# Patient Record
Sex: Male | Born: 1995
Health system: Southern US, Community
[De-identification: ages and names within clinical notes are randomized; demographics above are authoritative.]

## PROBLEM LIST (undated history)

## (undated) DIAGNOSIS — Q899 Congenital malformation, unspecified: Secondary | ICD-10-CM

## (undated) DIAGNOSIS — Z9889 Other specified postprocedural states: Secondary | ICD-10-CM

## (undated) HISTORY — DX: Congenital malformation, unspecified: Q89.9

---
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
## 2019-04-06 ENCOUNTER — Emergency Department (HOSPITAL_COMMUNITY): Payer: Self-pay

## 2019-04-06 ENCOUNTER — Other Ambulatory Visit: Payer: Self-pay

## 2019-04-06 ENCOUNTER — Inpatient Hospital Stay (HOSPITAL_COMMUNITY): Payer: Self-pay | Admitting: Certified Registered"

## 2019-04-06 ENCOUNTER — Encounter (HOSPITAL_COMMUNITY)
Admission: EM | Disposition: A | Discharge status: Home / Self Care | Payer: Self-pay | Source: Home / Self Care | Attending: Internal Medicine

## 2019-04-06 ENCOUNTER — Inpatient Hospital Stay (HOSPITAL_COMMUNITY)
Admission: EM | Admit: 2019-04-06 | Discharge: 2019-04-07 | DRG: 132 | Disposition: A | Discharge status: Home / Self Care | Payer: Self-pay | Attending: Internal Medicine | Admitting: Internal Medicine

## 2019-04-06 ENCOUNTER — Encounter (HOSPITAL_COMMUNITY): Payer: Self-pay | Admitting: Emergency Medicine

## 2019-04-06 DIAGNOSIS — K760 Fatty (change of) liver, not elsewhere classified: Secondary | ICD-10-CM | POA: Diagnosis present

## 2019-04-06 DIAGNOSIS — E66811 Obesity, class 1: Secondary | ICD-10-CM | POA: Diagnosis present

## 2019-04-06 DIAGNOSIS — K011 Impacted teeth: Secondary | ICD-10-CM | POA: Diagnosis present

## 2019-04-06 DIAGNOSIS — R739 Hyperglycemia, unspecified: Secondary | ICD-10-CM | POA: Diagnosis present

## 2019-04-06 DIAGNOSIS — S0181XA Laceration without foreign body of other part of head, initial encounter: Secondary | ICD-10-CM | POA: Diagnosis present

## 2019-04-06 DIAGNOSIS — S02609A Fracture of mandible, unspecified, initial encounter for closed fracture: Secondary | ICD-10-CM | POA: Diagnosis present

## 2019-04-06 DIAGNOSIS — F1010 Alcohol abuse, uncomplicated: Secondary | ICD-10-CM | POA: Diagnosis present

## 2019-04-06 DIAGNOSIS — Z20828 Contact with and (suspected) exposure to other viral communicable diseases: Secondary | ICD-10-CM

## 2019-04-06 DIAGNOSIS — S02651A Fracture of angle of right mandible, initial encounter for closed fracture: Secondary | ICD-10-CM | POA: Diagnosis present

## 2019-04-06 DIAGNOSIS — R402362 Coma scale, best motor response, obeys commands, at arrival to emergency department: Secondary | ICD-10-CM | POA: Diagnosis present

## 2019-04-06 DIAGNOSIS — F1210 Cannabis abuse, uncomplicated: Secondary | ICD-10-CM | POA: Diagnosis present

## 2019-04-06 DIAGNOSIS — E1165 Type 2 diabetes mellitus with hyperglycemia: Secondary | ICD-10-CM | POA: Diagnosis present

## 2019-04-06 DIAGNOSIS — Y92410 Unspecified street and highway as the place of occurrence of the external cause: Secondary | ICD-10-CM

## 2019-04-06 DIAGNOSIS — Z6831 Body mass index (BMI) 31.0-31.9, adult: Secondary | ICD-10-CM

## 2019-04-06 DIAGNOSIS — Z23 Encounter for immunization: Secondary | ICD-10-CM

## 2019-04-06 DIAGNOSIS — R7401 Elevation of levels of liver transaminase levels: Secondary | ICD-10-CM

## 2019-04-06 DIAGNOSIS — R402142 Coma scale, eyes open, spontaneous, at arrival to emergency department: Secondary | ICD-10-CM | POA: Diagnosis present

## 2019-04-06 DIAGNOSIS — Y93C2 Activity, hand held interactive electronic device: Secondary | ICD-10-CM

## 2019-04-06 DIAGNOSIS — F17200 Nicotine dependence, unspecified, uncomplicated: Secondary | ICD-10-CM | POA: Diagnosis present

## 2019-04-06 DIAGNOSIS — R7989 Other specified abnormal findings of blood chemistry: Secondary | ICD-10-CM | POA: Diagnosis present

## 2019-04-06 DIAGNOSIS — R402252 Coma scale, best verbal response, oriented, at arrival to emergency department: Secondary | ICD-10-CM | POA: Diagnosis present

## 2019-04-06 DIAGNOSIS — S02602B Fracture of unspecified part of body of left mandible, initial encounter for open fracture: Principal | ICD-10-CM | POA: Diagnosis present

## 2019-04-06 DIAGNOSIS — E669 Obesity, unspecified: Secondary | ICD-10-CM | POA: Diagnosis present

## 2019-04-06 HISTORY — PX: ORIF MANDIBULAR FRACTURE: SHX2127

## 2019-04-06 LAB — CBC
HCT: 48.8 % (ref 39.0–52.0)
Hemoglobin: 17.3 g/dL — ABNORMAL HIGH (ref 13.0–17.0)
MCH: 34.1 pg — ABNORMAL HIGH (ref 26.0–34.0)
MCHC: 35.5 g/dL (ref 30.0–36.0)
MCV: 96.1 fL (ref 80.0–100.0)
Platelets: 232 10*3/uL (ref 150–400)
RBC: 5.08 MIL/uL (ref 4.22–5.81)
RDW: 12.6 % (ref 11.5–15.5)
WBC: 18.7 10*3/uL — ABNORMAL HIGH (ref 4.0–10.5)
nRBC: 0 % (ref 0.0–0.2)

## 2019-04-06 LAB — GLUCOSE, CAPILLARY
Glucose-Capillary: 130 mg/dL — ABNORMAL HIGH (ref 70–99)
Glucose-Capillary: 138 mg/dL — ABNORMAL HIGH (ref 70–99)
Glucose-Capillary: 178 mg/dL — ABNORMAL HIGH (ref 70–99)
Glucose-Capillary: 168 mg/dL — ABNORMAL HIGH (ref 70–99)

## 2019-04-06 LAB — PROTIME-INR
INR: 1 (ref 0.8–1.2)
Prothrombin Time: 13.2 seconds (ref 11.4–15.2)

## 2019-04-06 LAB — HEPATIC FUNCTION PANEL
ALT: 453 U/L — ABNORMAL HIGH (ref 0–44)
AST: 351 U/L — ABNORMAL HIGH (ref 15–41)
Albumin: 3.4 g/dL — ABNORMAL LOW (ref 3.5–5.0)
Alkaline Phosphatase: 100 U/L (ref 38–126)
Bilirubin, Direct: 0.2 mg/dL (ref 0.0–0.2)
Indirect Bilirubin: 0.5 mg/dL (ref 0.3–0.9)
Total Bilirubin: 0.7 mg/dL (ref 0.3–1.2)
Total Protein: 6.5 g/dL (ref 6.5–8.1)

## 2019-04-06 LAB — PREPARE FRESH FROZEN PLASMA
Unit division: 0
Unit division: 0

## 2019-04-06 LAB — BPAM FFP
Blood Product Expiration Date: 202007092359
Blood Product Expiration Date: 202007092359
ISSUE DATE / TIME: 202006290257
ISSUE DATE / TIME: 202006290257
Unit Type and Rh: 6200
Unit Type and Rh: 6200

## 2019-04-06 LAB — COMPREHENSIVE METABOLIC PANEL
ALT: 554 U/L — ABNORMAL HIGH (ref 0–44)
AST: 442 U/L — ABNORMAL HIGH (ref 15–41)
Albumin: 4 g/dL (ref 3.5–5.0)
Alkaline Phosphatase: 144 U/L — ABNORMAL HIGH (ref 38–126)
Anion gap: 13 (ref 5–15)
BUN: <5 mg/dL — ABNORMAL LOW (ref 6–20)
CO2: 23 mmol/L (ref 22–32)
Calcium: 9.1 mg/dL (ref 8.9–10.3)
Chloride: 99 mmol/L (ref 98–111)
Creatinine, Ser: 0.86 mg/dL (ref 0.61–1.24)
GFR calc Af Amer: >60 mL/min (ref >60)
GFR calc non Af Amer: >60 mL/min (ref >60)
Glucose, Bld: 295 mg/dL — ABNORMAL HIGH (ref 70–99)
Potassium: 3.6 mmol/L (ref 3.5–5.1)
Sodium: 135 mmol/L (ref 135–145)
Total Bilirubin: 1 mg/dL (ref 0.3–1.2)
Total Protein: 7.2 g/dL (ref 6.5–8.1)

## 2019-04-06 LAB — I-STAT CHEM 8, ED
BUN: 4 mg/dL — ABNORMAL LOW (ref 6–20)
Calcium, Ion: 1.1 mmol/L — ABNORMAL LOW (ref 1.15–1.40)
Chloride: 101 mmol/L (ref 98–111)
Creatinine, Ser: 0.7 mg/dL (ref 0.61–1.24)
Glucose, Bld: 291 mg/dL — ABNORMAL HIGH (ref 70–99)
HCT: 54 % — ABNORMAL HIGH (ref 39.0–52.0)
Hemoglobin: 18.4 g/dL — ABNORMAL HIGH (ref 13.0–17.0)
Potassium: 3.4 mmol/L — ABNORMAL LOW (ref 3.5–5.1)
Sodium: 136 mmol/L (ref 135–145)
TCO2: 26 mmol/L (ref 22–32)

## 2019-04-06 LAB — HEMOGLOBIN A1C
Hgb A1c MFr Bld: 6.5 % — ABNORMAL HIGH (ref 4.8–5.6)
Mean Plasma Glucose: 139.85 mg/dL

## 2019-04-06 LAB — TYPE AND SCREEN
ABO/RH(D): O POS
Antibody Screen: NEGATIVE
Unit division: 0
Unit division: 0

## 2019-04-06 LAB — RAPID URINE DRUG SCREEN, HOSP PERFORMED
Amphetamines: NOT DETECTED
Barbiturates: NOT DETECTED
Benzodiazepines: POSITIVE — AB
Cocaine: NOT DETECTED
Opiates: NOT DETECTED
Tetrahydrocannabinol: POSITIVE — AB

## 2019-04-06 LAB — BPAM RBC
Blood Product Expiration Date: 202007292359
Blood Product Expiration Date: 202007292359
ISSUE DATE / TIME: 202006290257
ISSUE DATE / TIME: 202006290257
Unit Type and Rh: 5100
Unit Type and Rh: 5100

## 2019-04-06 LAB — LACTIC ACID, PLASMA
Lactic Acid, Venous: 3.2 mmol/L (ref 0.5–1.9)
Lactic Acid, Venous: 2.7 mmol/L (ref 0.5–1.9)

## 2019-04-06 LAB — ACETAMINOPHEN LEVEL: Acetaminophen (Tylenol), Serum: <10 ug/mL — ABNORMAL LOW (ref 10–30)

## 2019-04-06 LAB — ETHANOL: Alcohol, Ethyl (B): <10 mg/dL (ref <10)

## 2019-04-06 LAB — ABO/RH: ABO/RH(D): O POS

## 2019-04-06 LAB — GAMMA GT: GGT: 135 U/L — ABNORMAL HIGH (ref 7–50)

## 2019-04-06 LAB — BLOOD PRODUCT ORDER (VERBAL) VERIFICATION

## 2019-04-06 LAB — SARS CORONAVIRUS 2 BY RT PCR (HOSPITAL ORDER, PERFORMED IN ~~LOC~~ HOSPITAL LAB): SARS Coronavirus 2: NEGATIVE

## 2019-04-06 LAB — CDS SEROLOGY

## 2019-04-06 SURGERY — OPEN REDUCTION INTERNAL FIXATION (ORIF) MANDIBULAR FRACTURE
Anesthesia: General | Site: Face

## 2019-04-06 MED ORDER — PHENYLEPHRINE 40 MCG/ML (10ML) SYRINGE FOR IV PUSH (FOR BLOOD PRESSURE SUPPORT)
PREFILLED_SYRINGE | INTRAVENOUS | Status: DC | PRN
  Administered 2019-04-06 (×2): 80 ug via INTRAVENOUS
  Administered 2019-04-06: 120 ug via INTRAVENOUS
  Administered 2019-04-06: 80 ug via INTRAVENOUS

## 2019-04-06 MED ORDER — FENTANYL CITRATE (PF) 100 MCG/2ML IJ SOLN
75.0000 ug | Freq: Once | INTRAMUSCULAR | Status: AC
  Administered 2019-04-06: 75 ug via INTRAVENOUS
  Filled 2019-04-06: qty 2

## 2019-04-06 MED ORDER — SUCCINYLCHOLINE CHLORIDE 20 MG/ML IJ SOLN
INTRAMUSCULAR | Status: DC | PRN
  Administered 2019-04-06: 200 mg via INTRAVENOUS

## 2019-04-06 MED ORDER — ONDANSETRON HCL 4 MG/2ML IJ SOLN
4.0000 mg | Freq: Four times a day (QID) | INTRAMUSCULAR | Status: DC | PRN
  Administered 2019-04-06: 4 mg via INTRAVENOUS
  Filled 2019-04-06: qty 2

## 2019-04-06 MED ORDER — ROCURONIUM BROMIDE 10 MG/ML (PF) SYRINGE
PREFILLED_SYRINGE | INTRAVENOUS | Status: AC
  Filled 2019-04-06: qty 10

## 2019-04-06 MED ORDER — PROMETHAZINE HCL 25 MG/ML IJ SOLN: 6.2500 mg | INTRAMUSCULAR | Status: DC | PRN

## 2019-04-06 MED ORDER — LIDOCAINE-EPINEPHRINE 1 %-1:100000 IJ SOLN
INTRAMUSCULAR | Status: AC
  Filled 2019-04-06: qty 1

## 2019-04-06 MED ORDER — MORPHINE SULFATE (PF) 2 MG/ML IV SOLN
2.0000 mg | INTRAVENOUS | Status: DC | PRN
  Administered 2019-04-06 – 2019-04-07 (×5): 2 mg via INTRAVENOUS
  Filled 2019-04-06 (×5): qty 1

## 2019-04-06 MED ORDER — MUPIROCIN 2 % EX OINT
1.0000 "application " | TOPICAL_OINTMENT | Freq: Two times a day (BID) | CUTANEOUS | Status: DC
  Administered 2019-04-06 – 2019-04-07 (×2): 1 via NASAL
  Filled 2019-04-06: qty 22

## 2019-04-06 MED ORDER — HYDROMORPHONE HCL 1 MG/ML IJ SOLN
INTRAMUSCULAR | Status: AC
  Filled 2019-04-06: qty 1

## 2019-04-06 MED ORDER — PHENYLEPHRINE 40 MCG/ML (10ML) SYRINGE FOR IV PUSH (FOR BLOOD PRESSURE SUPPORT)
PREFILLED_SYRINGE | INTRAVENOUS | Status: AC
  Filled 2019-04-06: qty 10

## 2019-04-06 MED ORDER — CEFAZOLIN SODIUM-DEXTROSE 2-4 GM/100ML-% IV SOLN
2.0000 g | Freq: Once | INTRAVENOUS | Status: AC
  Administered 2019-04-06: 2 g via INTRAVENOUS
  Filled 2019-04-06: qty 100

## 2019-04-06 MED ORDER — DEXMEDETOMIDINE HCL IN NACL 200 MCG/50ML IV SOLN
INTRAVENOUS | Status: DC | PRN
  Administered 2019-04-06: 20 ug via INTRAVENOUS
  Administered 2019-04-06: 12 ug via INTRAVENOUS

## 2019-04-06 MED ORDER — LIDOCAINE-EPINEPHRINE (PF) 2 %-1:200000 IJ SOLN
20.0000 mL | Freq: Once | INTRAMUSCULAR | Status: AC
  Administered 2019-04-06: 20 mL via INTRADERMAL
  Filled 2019-04-06: qty 20

## 2019-04-06 MED ORDER — PROPOFOL 10 MG/ML IV BOLUS
INTRAVENOUS | Status: DC | PRN
  Administered 2019-04-06: 200 mg via INTRAVENOUS

## 2019-04-06 MED ORDER — SODIUM CHLORIDE 0.9 % IV BOLUS
1000.0000 mL | Freq: Once | INTRAVENOUS | Status: AC
  Administered 2019-04-06: 1000 mL via INTRAVENOUS

## 2019-04-06 MED ORDER — SODIUM CHLORIDE 0.9 % IV SOLN
INTRAVENOUS | Status: AC | PRN
  Administered 2019-04-06: 1000 mL via INTRAVENOUS

## 2019-04-06 MED ORDER — CLINDAMYCIN PHOSPHATE 600 MG/50ML IV SOLN
600.0000 mg | Freq: Four times a day (QID) | INTRAVENOUS | Status: DC
  Administered 2019-04-06 – 2019-04-07 (×4): 600 mg via INTRAVENOUS
  Filled 2019-04-06 (×4): qty 50

## 2019-04-06 MED ORDER — HYDROMORPHONE HCL 1 MG/ML IJ SOLN
0.5000 mg | Freq: Once | INTRAMUSCULAR | Status: AC
  Administered 2019-04-06: 0.5 mg via INTRAVENOUS
  Filled 2019-04-06: qty 1

## 2019-04-06 MED ORDER — DIPHENHYDRAMINE HCL 50 MG/ML IJ SOLN
INTRAMUSCULAR | Status: DC | PRN
  Administered 2019-04-06: 12.5 mg via INTRAVENOUS

## 2019-04-06 MED ORDER — OXYMETAZOLINE HCL 0.05 % NA SOLN
NASAL | Status: AC
  Filled 2019-04-06: qty 30

## 2019-04-06 MED ORDER — INSULIN ASPART 100 UNIT/ML ~~LOC~~ SOLN: 0.0000 [IU] | Freq: Every day | SUBCUTANEOUS | Status: DC

## 2019-04-06 MED ORDER — SUCCINYLCHOLINE CHLORIDE 200 MG/10ML IV SOSY
PREFILLED_SYRINGE | INTRAVENOUS | Status: AC
  Filled 2019-04-06: qty 10

## 2019-04-06 MED ORDER — FENTANYL CITRATE (PF) 250 MCG/5ML IJ SOLN
INTRAMUSCULAR | Status: AC
  Filled 2019-04-06: qty 5

## 2019-04-06 MED ORDER — CEFAZOLIN SODIUM-DEXTROSE 2-3 GM-%(50ML) IV SOLR
INTRAVENOUS | Status: DC | PRN
  Administered 2019-04-06: 2 g via INTRAVENOUS

## 2019-04-06 MED ORDER — CHLORHEXIDINE GLUCONATE 0.12 % MT SOLN
10.0000 mL | Freq: Three times a day (TID) | OROMUCOSAL | Status: DC
  Administered 2019-04-07 (×2): 10 mL via OROMUCOSAL
  Filled 2019-04-06: qty 15

## 2019-04-06 MED ORDER — LIDOCAINE 2% (20 MG/ML) 5 ML SYRINGE
INTRAMUSCULAR | Status: DC | PRN
  Administered 2019-04-06: 40 mg via INTRAVENOUS

## 2019-04-06 MED ORDER — SODIUM CHLORIDE 0.9% FLUSH: 3.0000 mL | Freq: Two times a day (BID) | INTRAVENOUS | Status: DC

## 2019-04-06 MED ORDER — MIDAZOLAM HCL 2 MG/2ML IJ SOLN: 0.5000 mg | Freq: Once | INTRAMUSCULAR | Status: DC | PRN

## 2019-04-06 MED ORDER — IOHEXOL 300 MG/ML  SOLN
100.0000 mL | Freq: Once | INTRAMUSCULAR | Status: AC | PRN
  Administered 2019-04-06: 100 mL via INTRAVENOUS

## 2019-04-06 MED ORDER — DEXAMETHASONE SODIUM PHOSPHATE 10 MG/ML IJ SOLN
INTRAMUSCULAR | Status: AC
  Filled 2019-04-06: qty 1

## 2019-04-06 MED ORDER — ONDANSETRON HCL 4 MG/2ML IJ SOLN
INTRAMUSCULAR | Status: DC | PRN
  Administered 2019-04-06: 4 mg via INTRAVENOUS

## 2019-04-06 MED ORDER — LACTATED RINGERS IV SOLN: INTRAVENOUS | Status: DC

## 2019-04-06 MED ORDER — PROPOFOL 10 MG/ML IV BOLUS
INTRAVENOUS | Status: AC
  Filled 2019-04-06: qty 20

## 2019-04-06 MED ORDER — MEPERIDINE HCL 25 MG/ML IJ SOLN: 6.2500 mg | INTRAMUSCULAR | Status: DC | PRN

## 2019-04-06 MED ORDER — BACITRACIN ZINC 500 UNIT/GM EX OINT
TOPICAL_OINTMENT | CUTANEOUS | Status: AC
  Filled 2019-04-06: qty 28.35

## 2019-04-06 MED ORDER — HYDROMORPHONE HCL 1 MG/ML IJ SOLN
0.2500 mg | INTRAMUSCULAR | Status: DC | PRN
  Administered 2019-04-06: 0.5 mg via INTRAVENOUS

## 2019-04-06 MED ORDER — LACTATED RINGERS IV SOLN
INTRAVENOUS | Status: DC
  Administered 2019-04-06 (×3): via INTRAVENOUS

## 2019-04-06 MED ORDER — SUGAMMADEX SODIUM 200 MG/2ML IV SOLN
INTRAVENOUS | Status: DC | PRN
  Administered 2019-04-06: 200 mg via INTRAVENOUS

## 2019-04-06 MED ORDER — OXYMETAZOLINE HCL 0.05 % NA SOLN
NASAL | Status: DC | PRN
  Administered 2019-04-06 (×2): 2 via NASAL

## 2019-04-06 MED ORDER — DIPHENHYDRAMINE HCL 50 MG/ML IJ SOLN
INTRAMUSCULAR | Status: AC
  Filled 2019-04-06: qty 1

## 2019-04-06 MED ORDER — MIDAZOLAM HCL 2 MG/2ML IJ SOLN
INTRAMUSCULAR | Status: AC
  Filled 2019-04-06: qty 2

## 2019-04-06 MED ORDER — ONDANSETRON HCL 4 MG PO TABS: 4.0000 mg | ORAL_TABLET | Freq: Four times a day (QID) | ORAL | Status: DC | PRN

## 2019-04-06 MED ORDER — INSULIN ASPART 100 UNIT/ML ~~LOC~~ SOLN
0.0000 [IU] | Freq: Three times a day (TID) | SUBCUTANEOUS | Status: DC
  Administered 2019-04-06 – 2019-04-07 (×2): 2 [IU] via SUBCUTANEOUS

## 2019-04-06 MED ORDER — ROCURONIUM BROMIDE 10 MG/ML (PF) SYRINGE
PREFILLED_SYRINGE | INTRAVENOUS | Status: DC | PRN
  Administered 2019-04-06: 20 mg via INTRAVENOUS
  Administered 2019-04-06: 80 mg via INTRAVENOUS
  Administered 2019-04-06: 50 mg via INTRAVENOUS
  Administered 2019-04-06: 10 mg via INTRAVENOUS
  Administered 2019-04-06: 20 mg via INTRAVENOUS

## 2019-04-06 MED ORDER — ACETAMINOPHEN 650 MG RE SUPP: 650.0000 mg | Freq: Four times a day (QID) | RECTAL | Status: DC | PRN

## 2019-04-06 MED ORDER — DEXMEDETOMIDINE HCL IN NACL 200 MCG/50ML IV SOLN
INTRAVENOUS | Status: AC
  Filled 2019-04-06: qty 50

## 2019-04-06 MED ORDER — DEXAMETHASONE SODIUM PHOSPHATE 10 MG/ML IJ SOLN
INTRAMUSCULAR | Status: DC | PRN
  Administered 2019-04-06: 10 mg via INTRAVENOUS

## 2019-04-06 MED ORDER — MIDAZOLAM HCL 2 MG/2ML IJ SOLN
INTRAMUSCULAR | Status: DC | PRN
  Administered 2019-04-06: 2 mg via INTRAVENOUS

## 2019-04-06 MED ORDER — TETANUS-DIPHTH-ACELL PERTUSSIS 5-2.5-18.5 LF-MCG/0.5 IM SUSP
0.5000 mL | Freq: Once | INTRAMUSCULAR | Status: AC
  Administered 2019-04-06: 0.5 mL via INTRAMUSCULAR
  Filled 2019-04-06: qty 0.5

## 2019-04-06 MED ORDER — FENTANYL CITRATE (PF) 100 MCG/2ML IJ SOLN
INTRAMUSCULAR | Status: DC | PRN
  Administered 2019-04-06 (×2): 50 ug via INTRAVENOUS
  Administered 2019-04-06 (×2): 25 ug via INTRAVENOUS
  Administered 2019-04-06: 100 ug via INTRAVENOUS

## 2019-04-06 MED ORDER — ACETAMINOPHEN 325 MG PO TABS: 650.0000 mg | ORAL_TABLET | Freq: Four times a day (QID) | ORAL | Status: DC | PRN

## 2019-04-06 MED ORDER — ONDANSETRON HCL 4 MG/2ML IJ SOLN
INTRAMUSCULAR | Status: AC
  Filled 2019-04-06: qty 2

## 2019-04-06 SURGICAL SUPPLY — 55 items
BIT DRILL 1.6X5 (BIT) ×1
BIT DRILL 1.6X5MM (BIT) IMPLANT
BIT DRILL RAINBOW 1.6X35 (BIT) ×1 IMPLANT
BLADE CLIPPER SURG (BLADE) IMPLANT
BLADE SURG 15 STRL LF DISP TIS (BLADE) IMPLANT
BLADE SURG 15 STRL SS (BLADE) ×1
CANISTER SUCT 3000ML PPV (MISCELLANEOUS) ×2 IMPLANT
CLEANER TIP ELECTROSURG 2X2 (MISCELLANEOUS) ×2 IMPLANT
COVER SURGICAL LIGHT HANDLE (MISCELLANEOUS) ×2 IMPLANT
COVER WAND RF STERILE (DRAPES) ×1 IMPLANT
DECANTER SPIKE VIAL GLASS SM (MISCELLANEOUS) ×2 IMPLANT
DRAPE HALF SHEET 40X57 (DRAPES) ×1 IMPLANT
DRILL BIT 1.6X5MM (BIT) ×1
ELECT COATED BLADE 2.86 ST (ELECTRODE) ×2 IMPLANT
ELECT REM PT RETURN 9FT ADLT (ELECTROSURGICAL) ×2
ELECTRODE REM PT RTRN 9FT ADLT (ELECTROSURGICAL) ×1 IMPLANT
GLOVE BIO SURGEON STRL SZ 6.5 (GLOVE) ×2 IMPLANT
GOWN STRL REUS W/ TWL LRG LVL3 (GOWN DISPOSABLE) ×2 IMPLANT
GOWN STRL REUS W/TWL LRG LVL3 (GOWN DISPOSABLE) ×2
KIT BASIN OR (CUSTOM PROCEDURE TRAY) ×2 IMPLANT
KIT TURNOVER KIT B (KITS) ×2 IMPLANT
NDL HYPO 25GX1X1/2 BEV (NEEDLE) IMPLANT
NDL PRECISIONGLIDE 27X1.5 (NEEDLE) ×1 IMPLANT
NEEDLE HYPO 25GX1X1/2 BEV (NEEDLE) ×2 IMPLANT
NEEDLE PRECISIONGLIDE 27X1.5 (NEEDLE) ×2 IMPLANT
NS IRRIG 1000ML POUR BTL (IV SOLUTION) ×2 IMPLANT
PAD ARMBOARD 7.5X6 YLW CONV (MISCELLANEOUS) ×6 IMPLANT
PENCIL BUTTON HOLSTER BLD 10FT (ELECTRODE) ×2 IMPLANT
PLATE 4 H MINI W/BAR (Plate) ×1 IMPLANT
PLATE HYBRID MMF SM (Plate) ×2 IMPLANT
PLATE MNDBLE FRACTURE 4H (Plate) ×1 IMPLANT
POSITIONER HEAD DONUT 9IN (MISCELLANEOUS) ×1 IMPLANT
SCISSORS WIRE ANG 4 3/4 DISP (INSTRUMENTS) ×2 IMPLANT
SCREW BONE CROSS PIN 2.0X05MM (Screw) ×4 IMPLANT
SCREW BONE CROSS PIN 2.0X10MM (Screw) ×1 IMPLANT
SCREW BONE CROSS PIN 2.0X12MM (Screw) ×2 IMPLANT
SCREW BONE CROSS PIN 2.0X14 (Screw) ×1 IMPLANT
SCREW LOCK SELFDRIL 2.0X8M MMF (Screw) ×10 IMPLANT
SUT ETHILON 4 0 CL P 3 (SUTURE) IMPLANT
SUT MON AB 3-0 SH 27 (SUTURE)
SUT MON AB 3-0 SH27 (SUTURE) IMPLANT
SUT PROLENE 6 0 PC 1 (SUTURE) IMPLANT
SUT STEEL 0 (SUTURE)
SUT STEEL 0 18XMFL TIE 17 (SUTURE) IMPLANT
SUT STEEL 1 (SUTURE) IMPLANT
SUT STEEL 2 (SUTURE) ×1 IMPLANT
SUT STEEL 4 (SUTURE) IMPLANT
SUT VIC AB 3-0 SH 27 (SUTURE) ×2
SUT VIC AB 3-0 SH 27X BRD (SUTURE) IMPLANT
SUT VICRYL 4-0 PS2 18IN ABS (SUTURE) IMPLANT
TEMPLATE PLATE LOCKING 4HOLE (Plate) ×1 IMPLANT
TOWEL NATURAL 6PK STERILE (DISPOSABLE) ×2 IMPLANT
TRAY ENT MC OR (CUSTOM PROCEDURE TRAY) ×2 IMPLANT
TRAY FOLEY MTR SLVR 14FR STAT (SET/KITS/TRAYS/PACK) IMPLANT
WATER STERILE IRR 1000ML POUR (IV SOLUTION) ×2 IMPLANT

## 2019-04-06 NOTE — Discharge Instructions (Addendum)
Cuidado de la ligadura maxilar con Intelalambres Wired Jaw Care Pueden ligarle el maxilar con alambres por muchas causas, que incluyen una fractura o una ciruga del Noatakmaxilar. Los alambres ayudan a Technical sales engineermantener el maxilar en su lugar durante la cicatrizacin. Siga estas indicaciones en su casa: Cuidado de la boca  Hgase enjuagues bucales con una mezcla de agua tibia con sal despus de comer o beber. Para preparar la mezcla de agua con sal, mezcle media cucharadita de sal en 1taza de agua tibia.  Si le recetaron un enjuague bucal con receta, selo como se lo haya indicado el mdico.  Cepllese la cara delantera de los dientes con un cepillo dental suave para nios despus de comer.  Si tiene que vomitar, inclnese y separe los labios. Despus de vomitar, siempre enjuguese la boca y Dillard'slvese los dientes. Control del dolor y de la hinchazn  Siga las indicaciones del mdico en cuanto a la forma de disminuir la hinchazn.  Si se lo indican, aplique hielo sobre la zona afectada. ? Ponga el hielo en una bolsa plstica. ? Coloque una FirstEnergy Corptoalla entre la piel y la bolsa de hielo. ? Coloque el hielo durante 20minutos, 2 a 3veces por da.  Para ayudar a reducir la hinchazn, sintese o colquese almohadas detrs de la espalda para apoyarse.  Tome los medicamentos de venta libre y los recetados solamente como se lo haya indicado el mdico.  Aplquese vaselina en los labios para evitar que se sequen y se agrieten.  Recubra los alambres con cera dental si se le clava alguno en los labios o las encas. Actividad  No conduzca ni use maquinaria pesada mientras toma analgsicos recetados.  No practique deportes hasta que el mdico lo autorice. Seguridad  Lleve siempre con usted alicates para cortar alambres. Solo en caso de emergencia, selos para cortar los alambres que BJ'smantienen unidos los maxilares.  Puede cortar los alambres que mantienen unidos a los Regions Financial Corporationmaxilares solo en los siguientes casos: ? Si  tiene dificultad para Industrial/product designerrespirar. ? Si se est ahogando.  Si debe cortar los Temple-Inlandalambres en un caso de Associate Professoremergencia, corte en forma recta los que le mantienen su boca cerrada. Estos son los alambres que estn conectados con los alambres que se unen a los molares (alambres de ortodoncia). No corte los alambres de ortodoncia. Qu debe comer y beber   Siga las indicaciones del mdico respecto de lo que puede y no Media plannerpuede comer.  Deber seguir Neomia Dearuna dieta lquida. Licue todos los alimentos para que puedan beberse con un sorbete o administrarse con Samule Dryuna jeringa. Evite las nueces, las semillas, las cscaras, los hollejos, los huesos o cualquier otro alimento que no pueda licuarse para Personnel officerlograr la consistencia adecuada.  Si tiene los dientes o la boca sensibles a las Astronomertemperaturas extremas, caliente o enfre los alimentos hasta lograr una temperatura templada. Instrucciones generales  No corte los alambres: ? Aunque Microbiologisttenga que vomitar. ? Aunque tenga apetito. ? Aunque est cansado de la Northwest Airlinesligadura maxilar.  Si toma analgsicos recetados, adopte medidas para prevenir o tratar el estreimiento. ? Tome un medicamento recetado o de venta libre para Magazine features editorel estreimiento. ? Beba suficiente lquido como para mantener la orina de color amarillo plido.  Concurra a todas las visitas de [address] Parkwayseguimiento como se lo haya indicado el mdico. Esto es importante. Comunquese con un mdico si:  Tiene fiebre.  Siente nuseas o vomita.  Siente que uno o ms alambres se han cortado.  Observa lquido, sangre o pus que emana de la  boca o las incisiones.  Tiene mareos. Solicite ayuda de inmediato si:  Debe cortar los alambres que BJ'smantienen unidos los maxilares.  El dolor es intenso y no se alivia con los medicamentos.  Se desmaya. Resumen  Pueden ligarle el maxilar con alambres despus de Burk[place] Fasouna fractura o una ciruga del maxilar. Los alambres ayudan a Technical sales engineermantener el maxilar en su lugar durante la cicatrizacin.  Hgase enjuagues  bucales con una mezcla de agua tibia con sal despus de comer o beber. Use un enjuague bucal con receta si su mdico se lo indica.  Siga las indicaciones de su mdico sobre cmo ayudar a reducir la hinchazn, cmo Runner, broadcasting/film/videomanejar el dolor, y qu debe comer y qu no.  Lleve siempre con usted alicates para cortar alambres. Solo en caso de emergencia, selos para cortar los alambres que BJ'smantienen unidos los maxilares. Corte en forma recta los alambres que mantienen su boca cerrada. No corte los alambres que se unen a los molares (alambre de ortodoncia). Esta informacin no tiene Theme park managercomo fin reemplazar el consejo del mdico. Asegrese de hacerle al mdico cualquier pregunta que tenga. Document Released: 06/06/2011 Document Revised: 01/06/2018 Document Reviewed: 01/06/2018 Elsevier Patient Education  2020 [employer]    Plan de alimentacin en caso de fractura de mandbula Jaw Fracture Eating Plan Neomia DearUna ruptura (fractura) del hueso de la mandbula suele requerir tratamiento quirrgico. Despus de la ciruga, deber consumir alimentos que se puedan licuar para que puedan beberse con un sorbete o administrarse con Samule Dryuna jeringa. Trabaje con un especialista en alimentacin y nutricin (nutricionista) para crear un plan de alimentacin que lo ayude a obtener los nutrientes que necesita a fin de curarse y Harley-Davidsonmantenerse saludable. Consejos para seguir este plan [place] generales  Safeco Corporationodos los alimentos del plan deben licuarse. Evite las nueces, las semillas, las cscaras, los hollejos, los huesos o cualquier otro alimento que no pueda licuarse para Personnel officerlograr la consistencia adecuada.  Consulte al mdico acerca de tomar un multivitamnico lquido para asegurarse de recibir todas las vitaminas y los minerales que necesita. Coccin   Antes de Consolidated Edisonlicuar los alimentos, quteles la cscara, las semillas o el hollejo.  Cocine las carnes y las verduras hasta que estn tiernas.  Corte los alimentos en porciones pequeas y mzclelos  con una pequea cantidad de lquido en una procesadora o licuadora. Contine agregando lquido The St. Paul Travelershasta que los alimentos sean lo suficientemente blandos como para poder beberlos con un sorbete.  Agregue jugo, Aberdeenleche, crema, caldo, salsa o jugo de verduras para darles ms sabor a los alimentos.  Caliente los alimentos una vez licuados, no antes. Esto hace que se reduzca la cantidad de espuma que se genera al licuarlos.  Si debe aumentar las caloras de los alimentos: ? Agregue protenas en polvo o leche en polvo a los alimentos. ? Cocine con grasas, como margarina (sin grasas trans), crema agria, queso crema, crema o mantequilla de frutos secos. ? Prepare los alimentos con endulzantes, en forma de miel, helado, melaza negra o azcar. Planificacin de las comidas  Consuma al menos tres comidas y tres refrigerios al Futures traderda. Es importante asegurarse de Baristaobtener una cantidad suficiente de caloras y protenas para evitar la prdida de peso y para que el cuerpo pueda sanar, especialmente despus de Cipriano Mileuna ciruga.  Consuma diversos alimentos de cada grupo de [address] Avealimentos todos los das, como frutas y verduras, protenas, cereales integrales, productos lcteos y grasas saludables.  Si tiene los dientes o la boca sensibles a las Astronomertemperaturas extremas, caliente o enfre los Teaching laboratory technicianalimentos hasta lograr  una temperatura templada. Qu alimentos se recomiendan? Esta podra no ser [employer]. Hable con el nutricionista sobre las mejores opciones alimenticias para usted. Cereales Cereales calientes, como avena, smola de maz, trigo molido y [place]. Arroz y pastas. Cuscs. [place] verduras cocidas o enlatadas, sin semillas ni cscara. Jugos de verduras. Papas cocidas, sin cscara. Frutas Todas las frutas cocidas o enlatadas, sin semillas ni cscara. Lambert Mody frescas y blandas sin cscara, como bananas y duraznos, que pueden licuarse para hacer batidos. Todos los jugos de frutas, sin semillas ni  cscara. Carne y otros alimentos proteicos Huevos pasados por agua, huevos poch, huevos en polvo, mezclas de [place] pasteurizadas y [place]. Carne picada, como en hamburguesas, pavo, salchichas y pan de carne. Carne roja, de ave y pescado, tierna y bien cocida, preparada sin huesos ni piel. Alimentos de soja suaves, como el tofu. Mantequilla suave de frutos secos. Sustitutos de Morgan [employer]. Lcteos Leche. Queso. Yogur. [employer] Warner. Pudin. Bebidas Caf (comn o descafeinado), t y agua mineral. Suplementos lquidos que contengan protenas y caloras. Grasas y Arboriculturist. Margarina o mantequilla derretidas. Mantequilla clarificada. [employer]. Queso crema. [place]. Condimentos y otros [employer] aderezos y condimentos que puedan mezclarse bien. Especias molidas. Semillas y frutos secos finamente molidos. Mostaza o cualquier condimento suave. Resumen  Es necesario preparar los alimentos de Donnellson plan para que puedan beberse con un sorbete o administrarse con [place]. Intente consumir al menos tres comidas y tres [place].  Evite las nueces, las semillas, las cscaras, los hollejos, los huesos o cualquier otro alimento que no pueda licuarse para Scientist, forensic la consistencia adecuada. Asegrese de consumir alimentos variados de cada grupo de alimentos todos [place].  Incluya un multivitamnico lquido en su plan segn las indicaciones del mdico o del nutricionista. Esta informacin no tiene Marine scientist el consejo del mdico. Asegrese de hacerle al mdico cualquier pregunta que tenga. Document Released: 10/27/2010 Document Revised: 06/20/2017 Document Reviewed: 06/20/2017 Elsevier Patient Education  2020 [place].  Jaw Fracture Nutrition Therapy   Because of your injury and the surgery that came afterward, youll have to change the foods you eat and the way theyre made.  All foods and drinks must be made to move easily through a straw or  syringe.  To make this happen, you will need to plan your meals carefully so that you are getting the healthy foods and drinks that you need to heal well. Tips  Your doctor or registered dietitian will tell you to eat the blended food with a straw or syringe. Prepare all foods to a very smooth and thin consistency:  o Cut foods into small pieces and place in a strong blender or food processor. o Add liquids--such as fruit juice, milk, broth, vegetable juice, gravy, and soup--to the blender. Use equal amounts of liquids and food. o Strain the blended food if it contains lumps or chunks.  Put extra blended foods in the refrigerator or freezer for later use.  Have plenty of foods and drinks made ahead of your surgery so that you can eat every 2 to 3 hours. Foods Recommended Food Group Foods Recommended  Milk and Milk Products All  Meat and Other Protein Foods Tender, well-cooked meat, poultry, or fish prepared without bones, skin, or added fat Well-cooked eggs prepared without added fat Soft soy foods (like tofu) Smooth nut butters  Grains Rice Pasta Couscous without seeds and nuts Cooked cereals, such as oatmeal and cream of wheat  Vegetables Any cooked or canned vegetables without seeds and skins  Fruits Any cooked or canned fruits without seeds and skins Fresh, peeled soft fruits (like peaches and bananas) that can be blended until smooth  Fats and Oils Any oils  Melted butter or margarine  Beverages Any Look for liquid supplements that provide both calories and protein, such as Valero EnergyCarnation Instant Breakfast, Boost, or Ensure  Other Ground spices, seeds, and nuts Smooth condiments, such as mustard  Foods Not Recommended Avoid all foods that contain whole nuts or seeds, pieces of nuts or seeds, skins, peels, or bones. All foods must be easily blended. Jaw Fracture Sample 1-Day Menu View Nutrient Info  Breakfast 1 cup cooked oatmeal 1 cup reduced-fat milk 1 teaspoon cinnamon 1  teaspoon brown sugar  Morning Snack 1 cup liquid pasteurized eggs (like Egg Beaters) 1 ripe banana 1 cup reduced-fat milk 1 tablespoon vanilla 1 teaspoon nutmeg  Lunch 2 cups cream of chicken soup 1/4 cup tender-cooked chicken 1/2 cup mashed potatoes 1/2 cup cooked vegetables 1 cup orange-pineapple juice  Afternoon Snack 12 oz liquid supplement (like Ensure)  Evening Meal 1 cup pasta sauce  Evening Snack 2 cups ice cream 1 cup milk   Copyright 2020  Academy of Nutrition and Dietetics. All rights reserved.

## 2019-04-06 NOTE — Op Note (Signed)
DATE OF PROCEDURE:  04/06/2019    PRE-OPERATIVE DIAGNOSIS:  mandible fracture    POST-OPERATIVE DIAGNOSIS:  Same    PROCEDURE(S): Open reduction internal fixation of mandible fractures Maxillomandibular fixation, using hybrid arch bars   SURGEON:  Gavin Pound, MD    ASSISTANT(S):  none    ANESTHESIA:  General nasotracheal anesthesia      ESTIMATED BLOOD LOSS:  15 mL   SPECIMENS:  none    COMPLICATIONS:  None    OPERATIVE FINDINGS:  The patient had a right ankle fracture and a left mildly comminuted anterior body fracture.  A left mandibular incisor was in the fracture line but not mobile and was therefore left in place.  Patient had a premorbid crossbite for multiple dentition.  Impacted wisdom tooth right mandible.     OPERATIVE DETAILS: After being properly identified in the preoperative holding area, the patient was brought into the operating suite.  He was placed supine on the operating table.  A pre-procedural time-out was performed. Pre-operative antibiotics were administered. After induction of general anesthesia, the patient was successfully intubated with confirmation of tube placement via CO2 return.  The patient was prepped and draped in the usual fashion for this procedure.   The patient's mouth was brushed and irrigated with half-strength hydrogen peroxide. A transvestibular incision was made through the mucosa 3 mm from the gingiva and carried down to the periosteum. Mandibular periosteum was elevated and care was taken to identify and preserve the mental nerve at the mental foramen. The fracture was noted to be comminuted, as detailed above.   A Stryker hybrid maxillomandibular arch bar was fitted to the maxillary teeth and screwed into place with 5, 2.0, 51mm self-drilling, locking screws. The lower dentition was then addressed. Again, a Stryker hybrid maxillomandibular arch bar was fitted to the maxillary teeth and screwed into place  with 5, 2.0, 14mm self-drilling, locking screws. The height mismatch was corrected while this arch bar was attached. Then, maxillomandibular fixation was performed. 24 gauge wires were looped around the arch bars of the maxillary and mandibular dentition and the teeth were held in their pre-morbid state of occlusion.   An appropriately sized 2.0 mm 4-hole miniplate was then selected from the Stryker set and bent to fit the contour of the mandible spanning the fracture line. Mono-cortical pilot holes were drilled into the mandible.  After sizing the mandible depth based on drill bit markings, 4 of the 6 mm diameter screws were used to secure the plate to the bone.  Next, a fracture plate, 4-hole with a spacer was then secured lower onto the mandible.  212 mm, 110 mm and 114 mm bone screws were placed to secure this plate in the mandible.  With the fractures successfully reduced and fixated, the surgical field was copiously irrigated with sterile saline. The mentalis muscle was resuspended with a 3.0 vicryl suture by taking a large bite of mentalis muscle along the midline and then a bite of periosteum and muscle from the superior edge of the incision. The labiogingival sulcus incision was closed with 3-0 Vicryl suture in a running locking fashion. A flexible suction catheter was used to suction out the patient's pharynx and esophagus.  With the surgical portion of the procedure complete, all instrumentation was then removed from the operative field. The patient's skin was cleaned.  The patient was returned to the care of the anesthesia team, weaned from anesthetic and transported to the PACU in stable condition.
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
## 2019-04-06 NOTE — H&P (Signed)
The surgical history remains accurate and without interval change. The condition still exists which makes the procedure necessary. The patient and/or family is aware of their condition and has been informed of the risks and benefits of surgery, as well as alternatives. All parties have elected to proceed with surgery.   Surgical plan: ORIF MMF mandible fracture  Will stay one night overnight.  Liquid diet post-op.  Home likely tomorrow WITH WIRE CUTTERS.   Helayne Seminole, MD

## 2019-04-06 NOTE — Transfer of Care (Signed)
Immediate Anesthesia Transfer of Care Note  Patient: Harold Mcfarland  Procedure(s) Performed: OPEN REDUCTION INTERNAL FIXATION (ORIF) MANDIBULAR FRACTURE (N/A Face)  Patient Location: PACU  Anesthesia Type:General  Level of Consciousness: awake, alert , oriented and patient cooperative  Airway & Oxygen Therapy: Patient Spontanous Breathing and Patient connected to face mask oxygen  Post-op Assessment: Report given to RN and Post -op Vital signs reviewed and stable  Post vital signs: Reviewed and stable  Last Vitals:  Vitals Value Taken Time  BP 138/82 04/06/19 1743  Temp 36.7 C 04/06/19 1745  Pulse 99 04/06/19 1750  Resp 14 04/06/19 1750  SpO2 97 % 04/06/19 1750  Vitals shown include unvalidated device data.  Last Pain:  Vitals:   04/06/19 1745  TempSrc:   PainSc: Asleep         Complications: No apparent anesthesia complications

## 2019-04-06 NOTE — ED Notes (Signed)
Patient transported to X-ray 

## 2019-04-06 NOTE — ED Notes (Signed)
ED Provider at bedside. 
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
## 2019-04-06 NOTE — ED Notes (Signed)
Pt to CT on monitor with Suezanne Jacquet, RN
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
## 2019-04-06 NOTE — Anesthesia Procedure Notes (Signed)
Procedure Name: Intubation Date/Time: 04/06/2019 3:21 PM Performed by: Barrington Ellison, CRNA Pre-anesthesia Checklist: Patient identified, Emergency Drugs available, Suction available and Patient being monitored Patient Re-evaluated:Patient Re-evaluated prior to induction Oxygen Delivery Method: Circle System Utilized Preoxygenation: Pre-oxygenation with 100% oxygen Induction Type: IV induction and Rapid sequence Laryngoscope Size: Glidescope and 4 Grade View: Grade I Nasal Tubes: Nasal Rae, Nasal prep performed and Right Number of attempts: 1 Placement Confirmation: ETT inserted through vocal cords under direct vision,  positive ETCO2 and breath sounds checked- equal and bilateral Secured at: 26 cm Tube secured with: Tape Dental Injury: Teeth and Oropharynx as per pre-operative assessment

## 2019-04-07 DIAGNOSIS — R739 Hyperglycemia, unspecified: Secondary | ICD-10-CM

## 2019-04-07 LAB — COMPREHENSIVE METABOLIC PANEL
ALT: 343 U/L — ABNORMAL HIGH (ref 0–44)
AST: 193 U/L — ABNORMAL HIGH (ref 15–41)
Albumin: 3.5 g/dL (ref 3.5–5.0)
Alkaline Phosphatase: 74 U/L (ref 38–126)
Anion gap: 9 (ref 5–15)
BUN: 5 mg/dL — ABNORMAL LOW (ref 6–20)
CO2: 25 mmol/L (ref 22–32)
Calcium: 8.8 mg/dL — ABNORMAL LOW (ref 8.9–10.3)
Chloride: 102 mmol/L (ref 98–111)
Creatinine, Ser: 0.76 mg/dL (ref 0.61–1.24)
GFR calc Af Amer: >60 mL/min (ref >60)
GFR calc non Af Amer: >60 mL/min (ref >60)
Glucose, Bld: 172 mg/dL — ABNORMAL HIGH (ref 70–99)
Potassium: 4.9 mmol/L (ref 3.5–5.1)
Sodium: 136 mmol/L (ref 135–145)
Total Bilirubin: 1.5 mg/dL — ABNORMAL HIGH (ref 0.3–1.2)
Total Protein: 6.7 g/dL (ref 6.5–8.1)

## 2019-04-07 LAB — HEPATITIS PANEL, ACUTE
HCV Ab: <0.1 s/co ratio (ref 0.0–0.9)
Hep A IgM: NEGATIVE
Hep B C IgM: NEGATIVE
Hepatitis B Surface Ag: NEGATIVE

## 2019-04-07 LAB — GLUCOSE, CAPILLARY
Glucose-Capillary: 140 mg/dL — ABNORMAL HIGH (ref 70–99)
Glucose-Capillary: 116 mg/dL — ABNORMAL HIGH (ref 70–99)

## 2019-04-07 LAB — CBC
HCT: 43.3 % (ref 39.0–52.0)
Hemoglobin: 15.1 g/dL (ref 13.0–17.0)
MCH: 33.6 pg (ref 26.0–34.0)
MCHC: 34.9 g/dL (ref 30.0–36.0)
MCV: 96.2 fL (ref 80.0–100.0)
Platelets: 184 10*3/uL (ref 150–400)
RBC: 4.5 MIL/uL (ref 4.22–5.81)
RDW: 12.6 % (ref 11.5–15.5)
WBC: 12 10*3/uL — ABNORMAL HIGH (ref 4.0–10.5)
nRBC: 0 % (ref 0.0–0.2)

## 2019-04-07 LAB — HIV ANTIBODY (ROUTINE TESTING W REFLEX): HIV Screen 4th Generation wRfx: NONREACTIVE

## 2019-04-07 MED ORDER — CLINDAMYCIN PALMITATE HCL 75 MG/5ML PO SOLR: 300.0000 mg | Freq: Three times a day (TID) | ORAL | 0 refills | Status: DC

## 2019-04-07 MED ORDER — HYDROCODONE-ACETAMINOPHEN 7.5-325 MG/15ML PO SOLN: 15.0000 mL | Freq: Four times a day (QID) | ORAL | 0 refills | Status: DC | PRN

## 2019-04-07 MED ORDER — CLINDAMYCIN PALMITATE HCL 75 MG/5ML PO SOLR: 300.0000 mg | Freq: Three times a day (TID) | ORAL | 0 refills | Status: AC

## 2019-04-07 MED ORDER — CHLORHEXIDINE GLUCONATE 0.12 % MT SOLN: 10.0000 mL | Freq: Three times a day (TID) | OROMUCOSAL | 1 refills | Status: DC

## 2019-04-07 MED ORDER — HYDROCODONE-ACETAMINOPHEN 7.5-325 MG/15ML PO SOLN: 15.0000 mL | Freq: Four times a day (QID) | ORAL | 0 refills | Status: AC | PRN

## 2019-04-07 MED FILL — HYDROCOD-APAP 7.5-325/15ML: 7.5-325 | 5 days supply | Qty: 430 | Fill #0

## 2019-04-07 MED FILL — CLINDAMYCIN 75 MG/5 ML SOLN: 75 | 10 days supply | Qty: 600 | Fill #0

## 2019-04-07 MED FILL — CHLORHEXIDINE 0.12% RINSE: 0.12 | 16 days supply | Qty: 473 | Fill #0

## 2019-04-07 NOTE — Progress Notes (Signed)
Pt discharged home in stable condition after going over discharge teaching with no concerns voiced 

## 2019-04-07 NOTE — Anesthesia Postprocedure Evaluation (Signed)
Anesthesia Post Note  Patient: Harold Mcfarland  Procedure(s) Performed: OPEN REDUCTION INTERNAL FIXATION (ORIF) MANDIBULAR FRACTURE (N/A Face)     Patient location during evaluation: PACU Anesthesia Type: General Level of consciousness: awake and alert Pain management: pain level controlled Vital Signs Assessment: post-procedure vital signs reviewed and stable Respiratory status: spontaneous breathing, nonlabored ventilation, respiratory function stable and patient connected to nasal cannula oxygen Cardiovascular status: blood pressure returned to baseline and stable Postop Assessment: no apparent nausea or vomiting Anesthetic complications: no    Last Vitals:  Vitals:   04/07/19 1239 04/07/19 1345  BP: 133/81 122/74  Pulse: 79 60  Resp: 18   Temp: 36.9 C 36.6 C  SpO2: 97% 100%    Last Pain:  Vitals:   04/07/19 1345  TempSrc: Oral  PainSc:                  Harold Mcfarland Asaph Serena

## 2019-04-07 NOTE — Discharge Summary (Signed)
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 Emergency Use Authorization (EUA).  This EUA will remain in effect (meaning this test can be used) for the duration of the COVID-19 declaration under Section 564(b)(1) of the Act, 21 U.S.C. section 360bbb-3(b)(1), unless the authorization is terminated or revoked sooner. Performed at [facility], [place] [address] [place], [place] [address]   Nasopharyngeal Culture     Status: None (Preliminary result)   Collection Time: 04/06/19  1:19 PM   Specimen: Nasal Mucosa; Nasopharyngeal  Result Value Ref Range Status   Specimen Description NASOPHARYNGEAL  Final   Special Requests Normal  Final   Culture   Final    CULTURE REINCUBATED FOR BETTER GROWTH Performed at [facility], [place] [address],  [place], [place] [address]    Report Status PENDING  Incomplete    Please note: You were cared for by a hospitalist during your hospital stay. Once you are discharged, your primary care physician will handle any further medical issues. Please note that NO REFILLS for any discharge medications will be authorized once you are discharged, as it is imperative that you return to your primary care physician (or establish a relationship with a primary care physician if you do not have one) for your post hospital discharge needs so that they can reassess your need for medications and monitor your lab values.    Time coordinating discharge: 40 minutes  SIGNED:   Shelly Coss, MD  [facility] 04/07/2019, 4:27 PM Pager [phone]  If 7PM-7AM, please contact night-coverage [email or web address] Password [email or web address]
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
## 2019-04-07 NOTE — Social Work (Signed)
CSW acknowledging consult, RNCM has provided pt with resources for ETOH cessation.  CSW signing off. Please consult if any additional needs arise.  Alexander Mt, [place] Work [phone]

## 2019-04-07 NOTE — Plan of Care (Addendum)
Nutrition Education Note  RD working remotely.   RD consulted for nutrition education regarding jaw fracture.   Spoke with pt over the phone, who was in good spirits today. He is tolerating the clear liquids presently. PTA, he was consuming 3 meals per day, which usually consisted of chicken and vegetables. Pt understanding of need for blenderized diet and reports he plans on making a lot of fruit smoothies until wires can be removed.   RD provided "Jaw Fracture Nutrition Therapy" handout from the Academy of Nutrition and Dietetics; pasted to AVS/ discharge summary and pt is aware that this is how he will receive written education materials. Reviewed patient's dietary recall. Discussed rationale for diet modifications and ways to modify foods so that pt could consume through a straw. Educated pt on how to blenderize foods to appropriate consistencies, as well as adding protein sources to foods (such as protein powder, milk, yogurt,ice cream, and peanut butter) for additional protein. Teach back method used.  Expect fair to good compliance.  Body mass index is 31.6 kg/m. Pt meets criteria for obesity, class I based on current BMI.  Current diet order is clear liquid, patient is consuming approximately 100% of meals at this time. Labs and medications reviewed. No further nutrition interventions warranted at this time. RD contact information provided. If additional nutrition issues arise, please re-consult RD.   Didi Ganaway A. Jimmye Norman, RD, LDN, [place] Registered Dietitian II Certified Diabetes Care and Education Specialist Pager: [phone] After hours Pager: [phone]

## 2019-04-08 ENCOUNTER — Encounter (HOSPITAL_COMMUNITY): Payer: Self-pay | Admitting: Otolaryngology

## 2019-04-09 LAB — NASOPHARYNGEAL CULTURE
Culture: NOT DETECTED
Special Requests: NORMAL

## 2019-04-15 ENCOUNTER — Encounter: Payer: Self-pay | Admitting: Nurse Practitioner

## 2019-04-15 ENCOUNTER — Other Ambulatory Visit: Payer: Self-pay

## 2019-04-15 ENCOUNTER — Ambulatory Visit: Payer: Self-pay | Attending: Nurse Practitioner | Admitting: Nurse Practitioner

## 2019-04-15 DIAGNOSIS — S02609A Fracture of mandible, unspecified, initial encounter for closed fracture: Secondary | ICD-10-CM

## 2019-04-15 DIAGNOSIS — Z7689 Persons encountering health services in other specified circumstances: Secondary | ICD-10-CM

## 2019-04-15 DIAGNOSIS — R7309 Other abnormal glucose: Secondary | ICD-10-CM
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
## 2021-08-29 ENCOUNTER — Other Ambulatory Visit: Payer: Self-pay

## 2021-08-29 ENCOUNTER — Encounter: Payer: Self-pay | Admitting: Podiatry

## 2021-08-29 ENCOUNTER — Ambulatory Visit (INDEPENDENT_AMBULATORY_CARE_PROVIDER_SITE_OTHER): Payer: No Typology Code available for payment source | Admitting: Podiatry

## 2021-08-29 VITALS — BP 132/82 | HR 58 | Temp 98.5°F | Resp 20

## 2021-08-29 DIAGNOSIS — L600 Ingrowing nail: Secondary | ICD-10-CM

## 2021-08-29 MED ORDER — GENTAMICIN SULFATE 0.1 % EX CREA
1.0000 "application " | TOPICAL_CREAM | Freq: Two times a day (BID) | CUTANEOUS | 1 refills | Status: DC
  Filled 2021-08-29: qty 30, 15d supply, fill #0

## 2021-08-29 MED ORDER — DOXYCYCLINE HYCLATE 100 MG PO TABS
100.0000 mg | ORAL_TABLET | Freq: Two times a day (BID) | ORAL | 0 refills | Status: DC
  Filled 2021-08-29: qty 20, 10d supply, fill #0

## 2021-08-29 MED ORDER — GENTAMICIN SULFATE 0.1 % EX CREA: 1.0000 "application " | TOPICAL_CREAM | Freq: Two times a day (BID) | CUTANEOUS | 1 refills | Status: DC

## 2021-08-29 MED ORDER — DOXYCYCLINE HYCLATE 100 MG PO TABS: 100.0000 mg | ORAL_TABLET | Freq: Two times a day (BID) | ORAL | 0 refills | Status: DC
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
## 2021-08-29 NOTE — Addendum Note (Signed)
Addended by: Felecia Shelling on: 08/29/2021 12:23 PM   Modules accepted: Orders

## 2021-08-30 ENCOUNTER — Telehealth: Payer: Self-pay | Admitting: *Deleted

## 2021-08-30 NOTE — Telephone Encounter (Signed)
"  I was looking to see where did you send my prescription."  I left him a message [facility] [place] [place].

## 2021-09-05 ENCOUNTER — Other Ambulatory Visit: Payer: Self-pay

## 2021-09-12 ENCOUNTER — Ambulatory Visit: Payer: No Typology Code available for payment source | Admitting: Podiatry

## 2022-05-18 ENCOUNTER — Ambulatory Visit
Admission: EM | Admit: 2022-05-18 | Discharge: 2022-05-18 | Disposition: A | Discharge status: Home / Self Care | Payer: Self-pay | Attending: Emergency Medicine | Admitting: Emergency Medicine

## 2022-05-18 DIAGNOSIS — B350 Tinea barbae and tinea capitis: Secondary | ICD-10-CM

## 2022-05-18 DIAGNOSIS — B354 Tinea corporis: Secondary | ICD-10-CM

## 2022-05-18 MED ORDER — GRISEOFULVIN MICROSIZE 500 MG PO TABS: 500.0000 mg | ORAL_TABLET | Freq: Every day | ORAL | 0 refills | Status: AC

## 2022-05-18 NOTE — Discharge Instructions (Addendum)
Take the griseofulvin as directed for ringworm in your scalp and on your skin. Follow up with your primary care provider if your symptoms are not improving.
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
## 2022-05-18 NOTE — ED Triage Notes (Signed)
Patient presents to Urgent Care with complaints of rash on neck x 4 days. 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 He is unsure if he came in contact with something or had an allergic reaction. Treating rash with OTC cream that has helped.   Denies fever.
